# Patient Record
Sex: Female | Born: 2000 | Race: White | Hispanic: No | Marital: Single | Smoking: Former smoker
Health system: Southern US, Community
[De-identification: ages and names within clinical notes are randomized; demographics above are authoritative.]

## PROBLEM LIST (undated history)

## (undated) ENCOUNTER — Inpatient Hospital Stay (HOSPITAL_COMMUNITY): Payer: Self-pay

## (undated) DIAGNOSIS — L709 Acne, unspecified: Secondary | ICD-10-CM

## (undated) DIAGNOSIS — F419 Anxiety disorder, unspecified: Secondary | ICD-10-CM

## (undated) DIAGNOSIS — N946 Dysmenorrhea, unspecified: Secondary | ICD-10-CM

## (undated) HISTORY — DX: Acne, unspecified: L70.9

## (undated) HISTORY — DX: Anxiety disorder, unspecified: F41.9

## (undated) HISTORY — DX: Dysmenorrhea, unspecified: N94.6

---
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
## 2020-02-16 ENCOUNTER — Encounter: Payer: Self-pay | Admitting: Family

## 2020-02-16 ENCOUNTER — Telehealth (INDEPENDENT_AMBULATORY_CARE_PROVIDER_SITE_OTHER): Payer: Medicaid Other | Admitting: Family

## 2020-02-16 DIAGNOSIS — F411 Generalized anxiety disorder: Secondary | ICD-10-CM | POA: Diagnosis not present

## 2020-02-16 DIAGNOSIS — Z3041 Encounter for surveillance of contraceptive pills: Secondary | ICD-10-CM

## 2020-02-16 DIAGNOSIS — L700 Acne vulgaris: Secondary | ICD-10-CM

## 2020-02-16 DIAGNOSIS — F320 Major depressive disorder, single episode, mild: Secondary | ICD-10-CM

## 2020-02-16 MED ORDER — SERTRALINE HCL 50 MG PO TABS: 50.0000 mg | ORAL_TABLET | Freq: Every day | ORAL | 1 refills | Status: DC

## 2020-02-16 MED ORDER — NORETHIN ACE-ETH ESTRAD-FE 1-20 MG-MCG PO TABS: ORAL_TABLET | ORAL | 4 refills | Status: DC

## 2020-02-16 NOTE — Progress Notes (Signed)
Virtual Visit via telephone Note Due to COVID-19 pandemic this visit was conducted virtually. This visit type was conducted due to national recommendations for restrictions regarding the COVID-19 Pandemic (e.g. social distancing, sheltering in place) in an effort to limit this patient's exposure and mitigate transmission in our community. All issues noted in this document were discussed and addressed.  A physical exam was not performed with this format.  I connected with [name] on 02/16/20 at 4:00 pm  by telephone and verified that I am speaking with the correct person using two identifiers. Annaliza Zia is currently located at car and no one is currently with her during visit. The provider, Jannifer Rodney, FNP is located in their office at time of visit.  I discussed the limitations, risks, security and privacy concerns of performing an evaluation and management service by telephone and the availability of in person appointments. I also discussed with the patient that there may be a patient responsible charge related to this service. The patient expressed understanding and agreed to proceed.   History and Present Illness:  Pt calls the office today to restart her depression medication. She is currently taking OC. She is not currently sexually active, but has been in the past.  Anxiety Presents for follow-up visit. Symptoms include depressed mood, excessive worry, irritability, nervous/anxious behavior and restlessness. The severity of symptoms is moderate. The quality of sleep is good.    Depression        This is a chronic problem.  The current episode started more than 1 year ago.   The onset quality is gradual.   The problem occurs intermittently.  The problem has been waxing and waning since onset.  Associated symptoms include helplessness, hopelessness, irritable, restlessness and sad.  Past treatments include nothing.  Past medical history includes anxiety.       Review of Systems   Constitutional: Positive for irritability.  Psychiatric/Behavioral: Positive for depression. The patient is nervous/anxious.      Observations/Objective: No SOB or distress noted   Assessment and Plan: 1. Generalized anxiety disorder Will restart Zoloft 50 mg  Stress management  Possible adverse effects discussed RTO in 4 weeks for recheck  - sertraline (ZOLOFT) 50 MG tablet; Take 1 tablet (50 mg total) by mouth daily.  Dispense: 90 tablet; Refill: 1  2. Depression, major, single episode, mild (HCC) - sertraline (ZOLOFT) 50 MG tablet; Take 1 tablet (50 mg total) by mouth daily.  Dispense: 90 tablet; Refill: 1  3. Acne vulgaris  4. Encounter for surveillance of contraceptive pills Safe sex - norethindrone-ethinyl estradiol (LOESTRIN FE) 1-20 MG-MCG tablet; TAKE ONE (1) TABLET EACH DAY  Dispense: 3 Package; Refill: 4    I discussed the assessment and treatment plan with the patient. The patient was provided an opportunity to ask questions and all were answered. The patient agreed with the plan and demonstrated an understanding of the instructions.   The patient was advised to call back or seek an in-person evaluation if the symptoms worsen or if the condition fails to improve as anticipated.  The above assessment and management plan was discussed with the patient. The patient verbalized understanding of and has agreed to the management plan. Patient is aware to call the clinic if symptoms persist or worsen. Patient is aware when to return to the clinic for a follow-up visit. Patient educated on when it is appropriate to go to the emergency department.   Time call ended:  4:15 pm  I provided 15  minutes of non-face-to-face time during this encounter.    Evelina Dun, FNP

## 2020-03-18 DIAGNOSIS — H5213 Myopia, bilateral: Secondary | ICD-10-CM | POA: Diagnosis not present

## 2020-07-05 DIAGNOSIS — J0100 Acute maxillary sinusitis, unspecified: Secondary | ICD-10-CM | POA: Diagnosis not present

## 2020-07-05 DIAGNOSIS — R110 Nausea: Secondary | ICD-10-CM | POA: Diagnosis not present

## 2020-07-05 DIAGNOSIS — R519 Headache, unspecified: Secondary | ICD-10-CM | POA: Diagnosis not present

## 2020-07-05 DIAGNOSIS — R0981 Nasal congestion: Secondary | ICD-10-CM | POA: Diagnosis not present

## 2020-07-05 DIAGNOSIS — J029 Acute pharyngitis, unspecified: Secondary | ICD-10-CM | POA: Diagnosis not present

## 2020-07-05 DIAGNOSIS — R52 Pain, unspecified: Secondary | ICD-10-CM | POA: Diagnosis not present

## 2020-07-05 DIAGNOSIS — R509 Fever, unspecified: Secondary | ICD-10-CM | POA: Diagnosis not present

## 2020-07-23 DIAGNOSIS — K529 Noninfective gastroenteritis and colitis, unspecified: Secondary | ICD-10-CM | POA: Diagnosis not present

## 2020-07-23 DIAGNOSIS — R110 Nausea: Secondary | ICD-10-CM | POA: Diagnosis not present

## 2020-11-30 ENCOUNTER — Other Ambulatory Visit: Payer: Self-pay

## 2020-11-30 ENCOUNTER — Encounter (HOSPITAL_COMMUNITY): Payer: Self-pay

## 2020-11-30 ENCOUNTER — Ambulatory Visit (HOSPITAL_COMMUNITY)
Admission: EM | Admit: 2020-11-30 | Discharge: 2020-11-30 | Disposition: A | Discharge status: Home / Self Care | Payer: Medicaid Other | Attending: Urgent Care | Admitting: Urgent Care

## 2020-11-30 DIAGNOSIS — Z793 Long term (current) use of hormonal contraceptives: Secondary | ICD-10-CM | POA: Diagnosis not present

## 2020-11-30 DIAGNOSIS — Z79899 Other long term (current) drug therapy: Secondary | ICD-10-CM | POA: Insufficient documentation

## 2020-11-30 DIAGNOSIS — Z7722 Contact with and (suspected) exposure to environmental tobacco smoke (acute) (chronic): Secondary | ICD-10-CM | POA: Insufficient documentation

## 2020-11-30 DIAGNOSIS — Z3202 Encounter for pregnancy test, result negative: Secondary | ICD-10-CM | POA: Diagnosis not present

## 2020-11-30 DIAGNOSIS — R5381 Other malaise: Secondary | ICD-10-CM | POA: Insufficient documentation

## 2020-11-30 DIAGNOSIS — R110 Nausea: Secondary | ICD-10-CM | POA: Insufficient documentation

## 2020-11-30 DIAGNOSIS — R070 Pain in throat: Secondary | ICD-10-CM | POA: Diagnosis not present

## 2020-11-30 DIAGNOSIS — U071 COVID-19: Secondary | ICD-10-CM | POA: Insufficient documentation

## 2020-11-30 DIAGNOSIS — R6883 Chills (without fever): Secondary | ICD-10-CM | POA: Diagnosis not present

## 2020-11-30 DIAGNOSIS — B349 Viral infection, unspecified: Secondary | ICD-10-CM

## 2020-11-30 DIAGNOSIS — R5383 Other fatigue: Secondary | ICD-10-CM | POA: Insufficient documentation

## 2020-11-30 LAB — POCT URINALYSIS DIPSTICK, ED / UC
Bilirubin Urine: NEGATIVE
Glucose, UA: NEGATIVE mg/dL
Hgb urine dipstick: NEGATIVE
Ketones, ur: 40 mg/dL — AB
Leukocytes,Ua: NEGATIVE
Nitrite: NEGATIVE
Protein, ur: NEGATIVE mg/dL
Specific Gravity, Urine: >=1.03 (ref 1.005–1.030)
Urobilinogen, UA: 2 mg/dL — ABNORMAL HIGH (ref 0.0–1.0)
pH: 5.5 (ref 5.0–8.0)

## 2020-11-30 LAB — POC URINE PREG, ED: Preg Test, Ur: NEGATIVE

## 2020-11-30 MED ORDER — ONDANSETRON 4 MG PO TBDP
8.0000 mg | ORAL_TABLET | Freq: Once | ORAL | Status: AC
  Administered 2020-11-30: 8 mg via ORAL

## 2020-11-30 MED ORDER — ONDANSETRON 4 MG PO TBDP
ORAL_TABLET | ORAL | Status: AC
  Filled 2020-11-30: qty 2

## 2020-11-30 MED ORDER — ONDANSETRON 8 MG PO TBDP: 8.0000 mg | ORAL_TABLET | Freq: Three times a day (TID) | ORAL | 0 refills | Status: DC | PRN

## 2020-11-30 NOTE — ED Triage Notes (Signed)
Pt presents with nausea and chills X 3 days.

## 2020-11-30 NOTE — ED Provider Notes (Signed)
[name]   MRN: [id number] DOB: 2000-11-02  Subjective:   Cassandra Waters is a 20 y.o. female presenting for 3 day history of acute onset nausea, chills, malaise, fatigue, decreased appetite, throat pain. She is not fully COVID vaccinated. Had 1 dose over the past summer. Denies chest pain, shob. LMP was 3 weeks ago. She is on OCP, sexually active, no condom use. No concern for STI.   No current facility-administered medications for this encounter.  Current Outpatient Medications:  .  norethindrone-ethinyl estradiol (LOESTRIN FE) 1-20 MG-MCG tablet, TAKE ONE (1) TABLET EACH DAY, Disp: 3 Package, Rfl: 4 .  sertraline (ZOLOFT) 50 MG tablet, Take 1 tablet (50 mg total) by mouth daily., Disp: 90 tablet, Rfl: 1   No Known Allergies  Past Medical History:  Diagnosis Date  . Acne   . Anxiety   . Dysmenorrhea      History reviewed. No pertinent surgical history.  Family History  Problem Relation Age of Onset  . Mental illness Mother   . Mental illness Father   . Mental illness Sister   . Mental illness Maternal Grandmother     Social History   Tobacco Use  . Smoking status: Passive Smoke Exposure - Never Smoker  . Smokeless tobacco: Never Used  Vaping Use  . Vaping Use: Never used  Substance Use Topics  . Alcohol use: No  . Drug use: No    ROS   Objective:   Vitals: BP 106/67 (BP Location: Left Arm)   Pulse (!) 108   Temp 98.8 F (37.1 C) (Oral)   Resp 18   SpO2 100%   Physical Exam Constitutional:      General: She is not in acute distress.    Appearance: Normal appearance. She is well-developed. She is not ill-appearing, toxic-appearing or diaphoretic.  HENT:     Head: Normocephalic and atraumatic.     Right Ear: Tympanic membrane and ear canal normal. No drainage or tenderness. No middle ear effusion. Tympanic membrane is not erythematous.     Left Ear: Tympanic membrane and ear canal normal. No drainage or tenderness.  No middle ear  effusion. Tympanic membrane is not erythematous.     Nose: Nose normal. No congestion or rhinorrhea.     Mouth/Throat:     Mouth: Mucous membranes are moist. No oral lesions.     Pharynx: Oropharynx is clear. No pharyngeal swelling, oropharyngeal exudate, posterior oropharyngeal erythema or uvula swelling.     Tonsils: No tonsillar exudate or tonsillar abscesses.  Eyes:     Extraocular Movements: Extraocular movements intact.     Right eye: Normal extraocular motion.     Left eye: Normal extraocular motion.     Conjunctiva/sclera: Conjunctivae normal.     Pupils: Pupils are equal, round, and reactive to light.  Cardiovascular:     Rate and Rhythm: Normal rate and regular rhythm.     Pulses: Normal pulses.     Heart sounds: Normal heart sounds. No murmur heard. No friction rub. No gallop.   Pulmonary:     Effort: Pulmonary effort is normal. No respiratory distress.     Breath sounds: Normal breath sounds. No stridor. No wheezing, rhonchi or rales.  Abdominal:     General: Bowel sounds are normal. There is no distension.     Palpations: Abdomen is soft. There is no mass.     Tenderness: There is no abdominal tenderness. There is no right CVA tenderness, left CVA tenderness, guarding  or rebound.  Musculoskeletal:     Cervical back: Normal range of motion and neck supple.  Lymphadenopathy:     Cervical: No cervical adenopathy.  Skin:    General: Skin is warm and dry.     Findings: No rash.  Neurological:     General: No focal deficit present.     Mental Status: She is alert and oriented to person, place, and time.  Psychiatric:        Mood and Affect: Mood normal.        Behavior: Behavior normal.        Thought Content: Thought content normal.        Judgment: Judgment normal.     Results for orders placed or performed during the hospital encounter of 11/30/20 (from the past 24 hour(s))  POC Urinalysis dipstick     Status: Abnormal   Collection Time: 11/30/20  1:40 PM   Result Value Ref Range   Glucose, UA NEGATIVE NEGATIVE mg/dL   Bilirubin Urine NEGATIVE NEGATIVE   Ketones, ur 40 (A) NEGATIVE mg/dL   Specific Gravity, Urine >=1.030 1.005 - 1.030   Hgb urine dipstick NEGATIVE NEGATIVE   pH 5.5 5.0 - 8.0   Protein, ur NEGATIVE NEGATIVE mg/dL   Urobilinogen, UA 2.0 (H) 0.0 - 1.0 mg/dL   Nitrite NEGATIVE NEGATIVE   Leukocytes,Ua NEGATIVE NEGATIVE  POC urine pregnancy     Status: None   Collection Time: 11/30/20  1:42 PM  Result Value Ref Range   Preg Test, Ur NEGATIVE NEGATIVE    Assessment and Plan :   PDMP not reviewed this encounter.  1. Viral syndrome   2. Chills   3. Nausea   4. Malaise and fatigue   5. Throat pain     Will manage for viral illness such as viral URI, viral syndrome, viral rhinitis, COVID-19. Counseled patient on nature of COVID-19 including modes of transmission, diagnostic testing, management and supportive care.  Offered scripts for symptomatic relief. COVID 19 testing is pending. Counseled patient on potential for adverse effects with medications prescribed/recommended today, ER and return-to-clinic precautions discussed, patient verbalized understanding.     Wallis Bamberg, PA-C 11/30/20 1430

## 2020-11-30 NOTE — Discharge Instructions (Addendum)
We will notify you of your COVID-19 test results as they arrive and may take between 24 to 48 hours.  I encourage you to sign up for MyChart if you have not already done so as this can be the easiest way for [place] to communicate results to you online or through a phone app.  In the meantime, if you develop worsening symptoms including fever, chest pain, shortness of breath despite our current treatment plan then please report to the emergency room as this may be a sign of worsening status from possible COVID-19 infection.  Otherwise, we will manage this as a viral syndrome. For sore throat or cough try using a honey-based tea. Use 3 teaspoons of honey with juice squeezed from half lemon. Place shaved pieces of ginger into 1/2-1 cup of water and warm over stove top. Then mix the ingredients and repeat every 4 hours as needed. Please take Tylenol 500mg -650mg  every 6 hours for aches and pains, fevers. Hydrate very well with at least 2 liters of water. Eat light meals such as soups to replenish electrolytes and soft fruits, veggies. Start an antihistamine like Zyrtec, Allegra or Claritin for postnasal drainage, sinus congestion.  You can take this together with pseudoephedrine (Sudafed) at a dose of 60 mg 2-3 times a day as needed for the same kind of congestion.  Make sure you push fluids drinking mostly water but mix it with Gatorade.  Try to eat light meals including soups, broths and soft foods, fruits.  You may use Zofran for your nausea and vomiting once every 8 hours.

## 2020-12-01 LAB — SARS CORONAVIRUS 2 (TAT 6-24 HRS): SARS Coronavirus 2: POSITIVE — AB

## 2020-12-02 ENCOUNTER — Telehealth: Payer: Self-pay | Admitting: Unknown Physician Specialty

## 2020-12-02 NOTE — Telephone Encounter (Signed)
Called to discuss with patient about COVID-19 symptoms and the use of one of the available treatments for those with mild to moderate Covid symptoms and at a high risk of hospitalization.  Pt appears to qualify for outpatient treatment due to co-morbid conditions and/or a member of an at-risk group in accordance with the FDA Emergency Use Authorization.    Symptom onset: 2/15 Vaccinated: incomplete  Doing better with almost complete resolution of symptoms.  Out of range for oral treatments and infusions not appropriate as almost asymptomatic  Cassandra Waters

## 2021-02-14 ENCOUNTER — Other Ambulatory Visit: Payer: Self-pay

## 2021-02-14 ENCOUNTER — Encounter (HOSPITAL_COMMUNITY): Payer: Self-pay | Admitting: Emergency Medicine

## 2021-02-14 ENCOUNTER — Ambulatory Visit (HOSPITAL_COMMUNITY)
Admission: EM | Admit: 2021-02-14 | Discharge: 2021-02-14 | Disposition: A | Discharge status: Home / Self Care | Payer: Medicaid Other | Attending: Internal Medicine | Admitting: Internal Medicine

## 2021-02-14 DIAGNOSIS — H1032 Unspecified acute conjunctivitis, left eye: Secondary | ICD-10-CM | POA: Diagnosis not present

## 2021-02-14 MED ORDER — ERYTHROMYCIN 5 MG/GM OP OINT: TOPICAL_OINTMENT | OPHTHALMIC | 0 refills | Status: DC

## 2021-02-14 NOTE — ED Triage Notes (Signed)
Pt presents today with c/o of left eye redness/irritation x 2 days. +drainage.

## 2021-02-14 NOTE — Discharge Instructions (Signed)
Place the erythromycin ointment on your lower eyelid 4 times a day for the next 5-7 days.  If your symptoms have resolved after 5 days, you can stop the eye ointment.    You can apply a warm compress to eye as needed for comfort.  Make sure you wash your hands regularly.  Wash your sheets and pillow cases.  Do not use any eye make-up while your eye is infected and get rid of any eye make-up that you have recently used.    If your symptoms persist, follow up with an ophthalmologist.    Return or go to the Emergency Department if symptoms worsen or do not improve in the next few days.

## 2021-02-14 NOTE — ED Provider Notes (Signed)
[facility]    CSN: [id number] Arrival date & time: 02/14/21  0160      History   Chief Complaint Chief Complaint  Patient presents with  . Eye Drainage  . eye irritation    Left     HPI Cassandra Waters is a 20 y.o. female.   Patient here for evaluation of left eye redness, pain, and swelling that started yesterday.  Reports having a lot of drainage from eye this morning.  Denies any vision changes or photophobia.  Has not tried any OTC medications or treatments.  Denies any trauma, injury, or other precipitating event.  Denies any specific alleviating or aggravating factors.  Denies any fevers, chest pain, shortness of breath, N/V/D, numbness, tingling, weakness, abdominal pain, or headaches.     The history is provided by the patient.    Past Medical History:  Diagnosis Date  . Acne   . Anxiety   . Dysmenorrhea     Patient Active Problem List   Diagnosis Date Noted  . 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 Encounter for surveillance of contraceptive pills 02/07/2019  . 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 Eczema 04/29/2017  . Generalized anxiety disorder 08/22/2016  . Acne vulgaris 08/22/2016  . Palpitations 12/17/2015    History reviewed. No pertinent surgical history.  OB History   No obstetric history on file.      Home Medications    Prior to Admission medications   Medication Sig Start Date End Date Taking? Authorizing Provider  erythromycin ophthalmic ointment Place a 1/2 inch ribbon of ointment into the lower eyelid 4 times a day for the next 5-7 days 02/14/21  Yes Ivette Loyal, NP  norethindrone-ethinyl estradiol (LOESTRIN FE) 1-20 MG-MCG tablet TAKE ONE (1) TABLET EACH DAY 02/16/20  Yes Hawks, Christy A, FNP  ondansetron (ZOFRAN-ODT) 8 MG disintegrating tablet Take 1 tablet (8 mg total) by mouth every 8 (eight) hours as needed for nausea or vomiting. 11/30/20   Wallis Bamberg, PA-C  sertraline (ZOLOFT) 50 MG tablet Take 1 tablet (50 mg  total) by mouth daily. 02/16/20   Junie Spencer, FNP    Family History Family History  Problem Relation Age of Onset  . Mental illness Mother   . Mental illness Father   . Mental illness Sister   . Mental illness Maternal Grandmother     Social History Social History   Tobacco Use  . Smoking status: Passive Smoke Exposure - Never Smoker  . Smokeless tobacco: Never Used  Vaping Use  . Vaping Use: Never used  Substance Use Topics  . Alcohol use: No  . Drug use: No     Allergies   Patient has no known allergies.   Review of Systems Review of Systems  Eyes: Positive for pain, discharge and redness. Negative for photophobia and visual disturbance.  All other systems reviewed and are negative.    Physical Exam Triage Vital Signs ED Triage Vitals  Enc Vitals Group     BP 02/14/21 0847 109/73     Pulse Rate 02/14/21 0847 80     Resp 02/14/21 0847 16     Temp 02/14/21 0847 98 F (36.7 C)     Temp Source 02/14/21 0847 Oral     SpO2 02/14/21 0847 97 %     Weight --      Height --      Head Circumference --      Peak Flow --  Pain Score 02/14/21 0844 2     Pain Loc --      Pain Edu? --      Excl. in GC? --    No data found.  Updated Vital Signs BP 109/73 (BP Location: Left Arm)   Pulse 80   Temp 98 F (36.7 C) (Oral)   Resp 16   LMP 02/07/2021 (Approximate)   SpO2 97%   Visual Acuity Right Eye Distance: 20/30 Left Eye Distance: 20/70 Bilateral Distance: 20/15  Right Eye Near:   Left Eye Near:    Bilateral Near:     Physical Exam Vitals and nursing note reviewed.  Constitutional:      General: She is not in acute distress.    Appearance: Normal appearance. She is not ill-appearing, toxic-appearing or diaphoretic.  HENT:     Head: Normocephalic and atraumatic.  Eyes:     General: Lids are normal.        Left eye: Discharge present.    Conjunctiva/sclera:     Left eye: Left conjunctiva is injected. Exudate present. No chemosis or  hemorrhage. Cardiovascular:     Rate and Rhythm: Normal rate.     Pulses: Normal pulses.  Pulmonary:     Effort: Pulmonary effort is normal.  Abdominal:     General: Abdomen is flat.  Musculoskeletal:        General: Normal range of motion.     Cervical back: Normal range of motion.  Skin:    General: Skin is warm and dry.  Neurological:     General: No focal deficit present.     Mental Status: She is alert and oriented to person, place, and time.  Psychiatric:        Mood and Affect: Mood normal.      UC Treatments / Results  Labs (all labs ordered are listed, but only abnormal results are displayed) Labs Reviewed - No data to display  EKG   Radiology No results found.  Procedures Procedures (including critical care time)  Medications Ordered in UC Medications - No data to display  Initial Impression / Assessment and Plan / UC Course  I have reviewed the triage vital signs and the nursing notes.  Pertinent labs & imaging results that were available during my care of the patient were reviewed by me and considered in my medical decision making (see chart for details).     Assessments negative for red flags or concerns.  This is likely conjunctivitis.  Treat with erythromycin 4 times daily for the next 5 to 7 days.  May stop ointment after 5 days if symptoms have resolved.  May apply warm compress as needed for comfort.  Encourage patient to wash sheets and pillowcases.  Patient instructed to discard any recently used eye make-up and to not wear any eye make-up while undergoing treatment.  If symptoms do not resolve follow-up with ophthalmology.  Final Clinical Impressions(s) / UC Diagnoses   Final diagnoses:  Acute bacterial conjunctivitis of left eye     Discharge Instructions     Place the erythromycin ointment on your lower eyelid 4 times a day for the next 5-7 days.  If your symptoms have resolved after 5 days, you can stop the eye ointment.    You can  apply a warm compress to eye as needed for comfort.  Make sure you wash your hands regularly.  Wash your sheets and pillow cases.  Do not use any eye make-up while your eye is infected  and get rid of any eye make-up that you have recently used.    If your symptoms persist, follow up with an ophthalmologist.    Return or go to the Emergency Department if symptoms worsen or do not improve in the next few days.      ED Prescriptions    Medication Sig Dispense Auth. Provider   erythromycin ophthalmic ointment Place a 1/2 inch ribbon of ointment into the lower eyelid 4 times a day for the next 5-7 days 3.5 g Ivette Loyal, NP     PDMP not reviewed this encounter.   Ivette Loyal, NP 02/14/21 [id number]

## 2021-04-23 ENCOUNTER — Telehealth: Payer: Self-pay

## 2021-04-23 NOTE — Telephone Encounter (Signed)
Pt needs a refill on birth control and is wanting to have an over the phone appt. She is aware that she may need to come in since the last time she had her birth control refilled it was done over the phone. Pt also aware that she needs a new PCP because she was an [employer] pt. Please call back and advise.

## 2021-04-23 NOTE — Telephone Encounter (Signed)
Please let her know she needs to be seen in the office.

## 2021-04-23 NOTE — Telephone Encounter (Signed)
Patient aware she states she will call back to make an appointment

## 2021-04-23 NOTE — Telephone Encounter (Signed)
Last visit was with you video over a year please advise

## 2021-05-02 DIAGNOSIS — N914 Secondary oligomenorrhea: Secondary | ICD-10-CM | POA: Diagnosis not present

## 2021-05-02 DIAGNOSIS — Z0000 Encounter for general adult medical examination without abnormal findings: Secondary | ICD-10-CM | POA: Diagnosis not present

## 2021-05-02 DIAGNOSIS — Z6838 Body mass index (BMI) 38.0-38.9, adult: Secondary | ICD-10-CM | POA: Diagnosis not present

## 2021-05-15 ENCOUNTER — Ambulatory Visit: Payer: Medicaid Other | Admitting: Family Medicine

## 2022-10-24 ENCOUNTER — Encounter (HOSPITAL_COMMUNITY): Payer: Self-pay

## 2022-10-24 ENCOUNTER — Emergency Department (HOSPITAL_COMMUNITY)
Admission: EM | Admit: 2022-10-24 | Discharge: 2022-10-24 | Disposition: A | Discharge status: Home / Self Care | Payer: BC Managed Care – PPO | Attending: Student | Admitting: Student

## 2022-10-24 DIAGNOSIS — J020 Streptococcal pharyngitis: Secondary | ICD-10-CM | POA: Diagnosis not present

## 2022-10-24 DIAGNOSIS — Z20822 Contact with and (suspected) exposure to covid-19: Secondary | ICD-10-CM | POA: Diagnosis not present

## 2022-10-24 DIAGNOSIS — J029 Acute pharyngitis, unspecified: Secondary | ICD-10-CM | POA: Diagnosis present

## 2022-10-24 LAB — RESP PANEL BY RT-PCR (RSV, FLU A&B, COVID)  RVPGX2
Influenza A by PCR: NEGATIVE
Influenza B by PCR: NEGATIVE
Resp Syncytial Virus by PCR: NEGATIVE
SARS Coronavirus 2 by RT PCR: NEGATIVE

## 2022-10-24 LAB — GROUP A STREP BY PCR: Group A Strep by PCR: DETECTED — AB

## 2022-10-24 MED ORDER — PENICILLIN G BENZATHINE 1200000 UNIT/2ML IM SUSY
1.2000 10*6.[IU] | PREFILLED_SYRINGE | Freq: Once | INTRAMUSCULAR | Status: AC
Start: 2022-10-24 — End: 2022-10-24
  Administered 2022-10-24: 1.2 10*6.[IU] via INTRAMUSCULAR
  Filled 2022-10-24: qty 2

## 2022-10-24 NOTE — ED Provider Notes (Signed)
[place] DEPT Provider Note   CSN: [id number] Arrival date & time: 10/24/22  1017     History  Chief Complaint  Patient presents with   Sore Throat    Cassandra Waters is a 22 y.o. female with medical history of anxiety, dysmenorrhea.  Patient presents to ED for evaluation of sore throat.  Patient reports waking up this morning with sore throat.  Patient denies sick contacts.  Patient also endorsing body aches and chills at home.  Patient denies fevers, nausea, vomiting or diarrhea.  Patient reports the symptoms been on and off ever since Christmas time, she was seen then and had a negative COVID and flu swab.   Sore Throat       Home Medications Prior to Admission medications   Medication Sig Start Date End Date Taking? Authorizing Provider  erythromycin ophthalmic ointment Place a 1/2 inch ribbon of ointment into the lower eyelid 4 times a day for the next 5-7 days 02/14/21   Pearson Forster, NP  norethindrone-ethinyl estradiol (LOESTRIN FE) 1-20 MG-MCG tablet TAKE ONE (1) TABLET EACH DAY 02/16/20   Evelina Dun A, FNP  ondansetron (ZOFRAN-ODT) 8 MG disintegrating tablet Take 1 tablet (8 mg total) by mouth every 8 (eight) hours as needed for nausea or vomiting. 11/30/20   Jaynee Eagles, PA-C  sertraline (ZOLOFT) 50 MG tablet Take 1 tablet (50 mg total) by mouth daily. 02/16/20   Sharion Balloon, FNP      Allergies    Patient has no known allergies.    Review of Systems   Review of Systems  Constitutional:  Negative for fever.  HENT:  Positive for sore throat. Negative for trouble swallowing.   Gastrointestinal:  Negative for diarrhea, nausea and vomiting.  All other systems reviewed and are negative.   Physical Exam Updated Vital Signs BP 116/80   Pulse 80   Temp 98.4 F (36.9 C) (Oral)   Resp 18   SpO2 100%  Physical Exam Vitals and nursing note reviewed.  Constitutional:      Appearance: Normal appearance.  HENT:     Head:  Normocephalic and atraumatic.     Mouth/Throat:     Mouth: Mucous membranes are moist.     Pharynx: Oropharynx is clear. Posterior oropharyngeal erythema present. No oropharyngeal exudate.  Eyes:     Extraocular Movements: Extraocular movements intact.     Conjunctiva/sclera: Conjunctivae normal.     Pupils: Pupils are equal, round, and reactive to light.  Cardiovascular:     Rate and Rhythm: Normal rate and regular rhythm.  Pulmonary:     Effort: Pulmonary effort is normal.     Breath sounds: Normal breath sounds. No wheezing.  Abdominal:     General: Abdomen is flat. Bowel sounds are normal.     Palpations: Abdomen is soft.     Tenderness: There is no abdominal tenderness.  Musculoskeletal:     Cervical back: Normal range of motion and neck supple. No tenderness.  Skin:    Capillary Refill: Capillary refill takes less than 2 seconds.  Neurological:     General: No focal deficit present.     Mental Status: She is alert.     ED Results / Procedures / Treatments   Labs (all labs ordered are listed, but only abnormal results are displayed) Labs Reviewed  GROUP A STREP BY PCR - Abnormal; Notable for the following components:      Result Value   Group A Strep by PCR  DETECTED (*)    All other components within normal limits  RESP PANEL BY RT-PCR (RSV, FLU A&B, COVID)  [id number]    EKG None  Radiology No results found.  Procedures Procedures   Medications Ordered in ED Medications  penicillin g benzathine (BICILLIN LA) 1200000 UNIT/2ML injection 1.2 Million Units (has no administration in time range)    ED Course/ Medical Decision Making/ A&P                           Medical Decision Making Risk Prescription drug management.   22 year old female presents to ED for evaluation of sore throat.  Please see HPI for further details.  On examination the patient is afebrile and nontachycardic.  Patient sounds are clear bilaterally, she is not hypoxic.  Patient abdomen  soft and compressible throughout.  Patient posterior oropharynx is erythematous however no exudate.  Patient not drooling, no change in phonation.  Patient nontoxic in appearance.  Patient group A strep testing is positive.  Patient viral testing is negative for all.  Patient treated with 1,200,000 units benzathine.  Patient will be advised to treat symptoms at home conservatively.  Patient given return precautions and she voiced understanding.  The patient is stable for discharge.  Final Clinical Impression(s) / ED Diagnoses Final diagnoses:  Pharyngitis due to Streptococcus species    Rx / DC Orders ED Discharge Orders     None         Lawana Chambers 10/24/22 [place]    Kommor, [place], MD 10/25/22 1911

## 2022-10-24 NOTE — Discharge Instructions (Addendum)
Return to ED with any new or worsening signs or symptoms Please read attached guide concerning strep throat Please treat symptoms at home conservatively utilizing ibuprofen and Tylenol, warm salt water rinses Please see attached work note.  It will be the last page of this packet.

## 2022-10-24 NOTE — ED Triage Notes (Signed)
Pt arrived via POV, c/o sore throat, generalized bodyaches and headache. States sx have been going on since around [place]. Was seen by PCP around that time and negative for COVID and flu.

## 2022-11-10 ENCOUNTER — Encounter (HOSPITAL_COMMUNITY): Payer: Self-pay

## 2022-11-10 ENCOUNTER — Emergency Department (HOSPITAL_COMMUNITY)
Admission: EM | Admit: 2022-11-10 | Discharge: 2022-11-10 | Disposition: A | Discharge status: Home / Self Care | Payer: BC Managed Care – PPO | Attending: Emergency Medicine | Admitting: Emergency Medicine

## 2022-11-10 DIAGNOSIS — M79644 Pain in right finger(s): Secondary | ICD-10-CM | POA: Diagnosis present

## 2022-11-10 DIAGNOSIS — L03011 Cellulitis of right finger: Secondary | ICD-10-CM | POA: Insufficient documentation

## 2022-11-10 MED ORDER — LIDOCAINE HCL 2 % IJ SOLN
10.0000 mL | Freq: Once | INTRAMUSCULAR | Status: AC
  Administered 2022-11-10: 200 mg
  Filled 2022-11-10: qty 20

## 2022-11-10 NOTE — Discharge Instructions (Signed)
Return to ED with any new symptoms Please follow-up with PCP for further management Please soak finger in Epsom salt baths 3 times daily.  Please keep finger covered with gauze, triple antibiotic ointment for the next 3 to 5 days. Please read attached guide concerning paronychia

## 2022-11-10 NOTE — ED Triage Notes (Signed)
Pt presents with c/o right middle finger pain, redness, and swelling for 8 days. The pain, swelling, and redness is all on the tip of the finger.

## 2022-11-10 NOTE — ED Provider Notes (Signed)
[place] EMERGENCY DEPARTMENT AT [facility] Provider Note   CSN: [id number] Arrival date & time: 11/10/22  1346     History  Chief Complaint  Patient presents with   Hand Pain    Cassandra Waters is a 22 y.o. female with medical history of anxiety.  Patient presents to the ED for evaluation of swelling to the cuticle area of her right middle finger.  Patient reports this is been present for the last 8 days.  Patient reports slight drainage from this area.  Patient denies history of the same.  Patient denies fevers, nausea or vomiting.   Hand Pain       Home Medications Prior to Admission medications   Medication Sig Start Date End Date Taking? Authorizing Provider  erythromycin ophthalmic ointment Place a 1/2 inch ribbon of ointment into the lower eyelid 4 times a day for the next 5-7 days 02/14/21   Pearson Forster, NP  norethindrone-ethinyl estradiol (LOESTRIN FE) 1-20 MG-MCG tablet TAKE ONE (1) TABLET EACH DAY 02/16/20   Evelina Dun A, FNP  ondansetron (ZOFRAN-ODT) 8 MG disintegrating tablet Take 1 tablet (8 mg total) by mouth every 8 (eight) hours as needed for nausea or vomiting. 11/30/20   Jaynee Eagles, PA-C  sertraline (ZOLOFT) 50 MG tablet Take 1 tablet (50 mg total) by mouth daily. 02/16/20   Sharion Balloon, FNP      Allergies    Patient has no known allergies.    Review of Systems   Review of Systems  Constitutional:  Negative for fever.  Gastrointestinal:  Negative for nausea and vomiting.  Skin:        Paronychia  All other systems reviewed and are negative.   Physical Exam Updated Vital Signs BP 103/68 (BP Location: Right Arm)   Pulse 82   Temp 98.1 F (36.7 C) (Oral)   Resp 18   LMP 10/11/2022 (Approximate)   SpO2 98%  Physical Exam Vitals and nursing note reviewed.  Constitutional:      General: She is not in acute distress.    Appearance: She is well-developed.  HENT:     Head: Normocephalic and atraumatic.  Eyes:      Conjunctiva/sclera: Conjunctivae normal.  Cardiovascular:     Rate and Rhythm: Normal rate and regular rhythm.     Heart sounds: No murmur heard. Pulmonary:     Effort: Pulmonary effort is normal. No respiratory distress.     Breath sounds: Normal breath sounds.  Abdominal:     Palpations: Abdomen is soft.     Tenderness: There is no abdominal tenderness.  Musculoskeletal:        General: No swelling.     Cervical back: Neck supple.     Comments: Erythema, fluctuance to proximal finger nail fold about cuticle consistent with paronychia, no drainage  Skin:    General: Skin is warm and dry.     Capillary Refill: Capillary refill takes less than 2 seconds.  Neurological:     Mental Status: She is alert.  Psychiatric:        Mood and Affect: Mood normal.     ED Results / Procedures / Treatments   Labs (all labs ordered are listed, but only abnormal results are displayed) Labs Reviewed - No data to display  EKG None  Radiology No results found.  Procedures .Marland KitchenIncision and Drainage  Date/Time: 11/10/2022 3:39 PM  Performed by: Azucena Cecil, PA-C Authorized by: Azucena Cecil, PA-C   Consent:  Consent obtained:  Verbal   Consent given by:  Patient   Risks, benefits, and alternatives were discussed: yes     Risks discussed:  Bleeding, damage to other organs, infection, incomplete drainage and pain   Alternatives discussed:  No treatment Universal protocol:    Patient identity confirmed:  Verbally with patient Location:    Indications for incision and drainage: Paronychia.   Location:  Upper extremity   Upper extremity location:  Finger   Finger location:  R long finger Pre-procedure details:    Skin preparation:  Povidone-iodine Anesthesia:    Anesthesia method:  Local infiltration   Local anesthetic:  Lidocaine 2% w/o epi Procedure type:    Complexity:  Simple Procedure details:    Incision types:  Stab incision   Wound management:  Probed and  deloculated   Drainage:  Bloody and purulent   Drainage amount:  Scant   Packing materials:  None Post-procedure details:    Procedure completion:  Tolerated well, no immediate complications     Medications Ordered in ED Medications  lidocaine (XYLOCAINE) 2 % (with pres) injection 200 mg (200 mg Other Given by Other 11/10/22 1456)    ED Course/ Medical Decision Making/ A&P                          Medical Decision Making Risk Prescription drug management.   22 year old female presents to the ED for evaluation.  Please see HPI for further details.  On examination patient afebrile and nontachycardic.  Patient sounds good bilaterally, not hypoxic room air.  Patient abdomen soft compressible throughout.  Patient does have fluctuance, erythema to proximal nail fold about right middle finger consistent with paronychia.  There is no drainage.  Patient is neurovascularly intact to her finger, hand.  No sign of felon.  Patient paronychia drained per procedure note.  Small amount of pus was returned.  Patient be discharged and advised to soak right middle finger in Epsom salt baths 3 times daily and keep right middle finger covered with triple antibiotic ointment for the next 3 to 5 days.  Patient advised to follow-up with PCP.  Patient given return precautions and she voiced understanding.  Patient had all of her questions answered her satisfaction.  The patient is stable for discharge.   Final Clinical Impression(s) / ED Diagnoses Final diagnoses:  Paronychia of finger, right    Rx / DC Orders ED Discharge Orders     None         Lawana Chambers 11/10/22 1541    Carmin Muskrat, MD 11/10/22 1645

## 2022-11-16 ENCOUNTER — Emergency Department (HOSPITAL_COMMUNITY)
Admission: EM | Admit: 2022-11-16 | Discharge: 2022-11-16 | Disposition: A | Discharge status: Home / Self Care | Payer: BC Managed Care – PPO | Attending: Emergency Medicine | Admitting: Emergency Medicine

## 2022-11-16 ENCOUNTER — Other Ambulatory Visit: Payer: Self-pay

## 2022-11-16 ENCOUNTER — Emergency Department (HOSPITAL_COMMUNITY): Payer: BC Managed Care – PPO

## 2022-11-16 DIAGNOSIS — L03011 Cellulitis of right finger: Secondary | ICD-10-CM | POA: Diagnosis not present

## 2022-11-16 DIAGNOSIS — M79641 Pain in right hand: Secondary | ICD-10-CM | POA: Diagnosis present

## 2022-11-16 MED ORDER — DOXYCYCLINE HYCLATE 100 MG PO CAPS: 100.0000 mg | ORAL_CAPSULE | Freq: Two times a day (BID) | ORAL | 0 refills | Status: AC

## 2022-11-16 MED ORDER — LIDOCAINE HCL (PF) 1 % IJ SOLN
10.0000 mL | Freq: Once | INTRAMUSCULAR | Status: AC
  Administered 2022-11-16: 10 mL
  Filled 2022-11-16: qty 30

## 2022-11-16 MED ORDER — DOXYCYCLINE HYCLATE 100 MG PO TABS
100.0000 mg | ORAL_TABLET | Freq: Once | ORAL | Status: AC
  Administered 2022-11-16: 100 mg via ORAL
  Filled 2022-11-16: qty 1

## 2022-11-16 MED ORDER — DOXYCYCLINE HYCLATE 100 MG PO CAPS: 100.0000 mg | ORAL_CAPSULE | Freq: Two times a day (BID) | ORAL | 0 refills | Status: DC

## 2022-11-16 NOTE — ED Triage Notes (Signed)
C/o right middle finger pain with redness and swelling x2 weeks.  Pt reports last night started having pus drainage.  Per patient I&D on 1/29.  Denies fever, bodyaches, chills.

## 2022-11-16 NOTE — Discharge Instructions (Addendum)
You are seen today for infection of your finger.  You are given antibiotics after we drained the small abscess.  Keep the area clean and dry keep it covered especially when at work and he can continue using the soaks a 1-2 times a day.  Follow-up for a wound check, 2 days and come back to the ER for new or worsening symptoms.

## 2022-11-16 NOTE — ED Provider Notes (Signed)
[place] EMERGENCY DEPARTMENT AT [facility] Provider Note   CSN: [id number] Arrival date & time: 11/16/22  1409     History  Chief Complaint  Patient presents with   Hand Pain    Cassandra Waters is a 22 y.o. female.  Denies any past medical history.  She had a paronychia drained 5 days ago and has been using antibiotic cream and Epsom salt soaks as directed but has noted that the redness has increased around the nail fold, and she is having a lot of tenderness where feels like the skin is growing up arrival nail laterally.   Hand Pain       Home Medications Prior to Admission medications   Medication Sig Start Date End Date Taking? Authorizing Provider  erythromycin ophthalmic ointment Place a 1/2 inch ribbon of ointment into the lower eyelid 4 times a day for the next 5-7 days 02/14/21   Pearson Forster, NP  norethindrone-ethinyl estradiol (LOESTRIN FE) 1-20 MG-MCG tablet TAKE ONE (1) TABLET EACH DAY 02/16/20   Evelina Dun A, FNP  ondansetron (ZOFRAN-ODT) 8 MG disintegrating tablet Take 1 tablet (8 mg total) by mouth every 8 (eight) hours as needed for nausea or vomiting. 11/30/20   Jaynee Eagles, PA-C  sertraline (ZOLOFT) 50 MG tablet Take 1 tablet (50 mg total) by mouth daily. 02/16/20   Sharion Balloon, FNP      Allergies    Patient has no known allergies.    Review of Systems   Review of Systems  Physical Exam Updated Vital Signs BP 107/79   Pulse 83   Temp 98.5 F (36.9 C)   Resp 20   Wt 59 kg   LMP 10/20/2022   SpO2 99%   BMI 25.40 kg/m  Physical Exam Vitals and nursing note reviewed.  Constitutional:      General: She is not in acute distress.    Appearance: She is well-developed.  HENT:     Head: Normocephalic and atraumatic.  Eyes:     Conjunctiva/sclera: Conjunctivae normal.  Cardiovascular:     Rate and Rhythm: Normal rate and regular rhythm.     Heart sounds: No murmur heard. Pulmonary:     Effort: Pulmonary effort is normal. No  respiratory distress.     Breath sounds: Normal breath sounds.  Abdominal:     Palpations: Abdomen is soft.     Tenderness: There is no abdominal tenderness.  Musculoskeletal:        General: No swelling.     Cervical back: Neck supple.  Skin:    General: Skin is warm and dry.     Capillary Refill: Capillary refill takes less than 2 seconds.     Comments: Cellulitis around nail fold medial lateral and proximal middle finger of right hand with fluctuance, no active drainage.  Neurological:     Mental Status: She is alert and oriented to person, place, and time.  Psychiatric:        Mood and Affect: Mood normal.     ED Results / Procedures / Treatments   Labs (all labs ordered are listed, but only abnormal results are displayed) Labs Reviewed - No data to display  EKG None  Radiology DG Finger Middle Right  Result Date: 11/16/2022 CLINICAL DATA:  Pain, redness and swelling to distal phalanx. EXAM: RIGHT MIDDLE FINGER 2+V COMPARISON:  None Available. FINDINGS: Mild diffuse edema about the distal phalanx. No underlying fracture or dislocation identified. No significant arthropathy. IMPRESSION: Soft tissue edema.  No acute bone abnormality. Electronically Signed   By: Kerby Moors M.D.   On: 11/16/2022 15:13    Procedures .Marland KitchenIncision and Drainage  Date/Time: 11/16/2022 4:37 PM  Performed by: Gwenevere Abbot, PA-C Authorized by: Gwenevere Abbot, PA-C   Consent:    Consent obtained:  Verbal   Consent given by:  Patient   Risks discussed:  Bleeding, incomplete drainage and pain   Alternatives discussed:  No treatment Universal protocol:    Patient identity confirmed:  Verbally with patient Location:    Size:  Paronychia   Location: right middle finger. Pre-procedure details:    Skin preparation:  Povidone-iodine Sedation:    Sedation type:  None Anesthesia:    Anesthesia method:  Nerve block   Block needle gauge:  25 G   Block anesthetic:  Lidocaine 1% w/o epi    Block technique:  Digital block   Block outcome:  Anesthesia achieved Procedure type:    Complexity:  Simple Procedure details:    Incision types:  Stab incision   Wound management:  Probed and deloculated   Drainage:  Purulent and bloody   Drainage amount:  Moderate   Packing materials:  None Post-procedure details:    Procedure completion:  Tolerated well, no immediate complications     Medications Ordered in ED Medications  lidocaine (PF) (XYLOCAINE) 1 % injection 10 mL (10 mLs Infiltration Given by Other 11/16/22 1454)    ED Course/ Medical Decision Making/ A&P                             Medical Decision Making This patient presents to the ED for concern of left finger infection, this involves an extensive number of treatment options, and is a complaint that carries with it a high risk of complications and morbidity.  The differential diagnosis include paronychia, felon,, cellulitis, osteomyelitis, other   Additional history obtained:  Additional history obtained from EMR External records from outside source obtained and reviewed including prior ED visit    Imaging Studies ordered:  I ordered imaging studies including left middle finger x-ray I independently visualized and interpreted imaging which showed no fracture or dislocation, no bony changes I agree with the radiologist interpretation     Problem List / ED Course / Critical interventions / Medication management  Paronychia-patient had this drained about 5 days ago has been keeping it covered and doing soaks but the swelling is getting worse and there is more redness.  Small accumulation of pus was drained and patient now has obvious cellulitis so was put on doxycycline at this time.  She is advised on close follow-up for wound check and strict return precautions. I ordered medication including lidocaine for local anesthesia Reevaluation of the patient after these medicines showed that the patient improved I  have reviewed the patients home medicines and have made adjustments as needed        Amount and/or Complexity of Data Reviewed Radiology: ordered.  Risk Prescription drug management.           Final Clinical Impression(s) / ED Diagnoses Final diagnoses:  None    Rx / DC Orders ED Discharge Orders     None         Darci Current 11/16/22 1655    Isla Pence, MD 11/16/22 1744

## 2023-02-21 ENCOUNTER — Ambulatory Visit (HOSPITAL_COMMUNITY)
Admission: EM | Admit: 2023-02-21 | Discharge: 2023-02-21 | Disposition: A | Discharge status: Home / Self Care | Payer: BC Managed Care – PPO

## 2023-02-21 ENCOUNTER — Encounter (HOSPITAL_COMMUNITY): Payer: Self-pay

## 2023-02-21 DIAGNOSIS — H1013 Acute atopic conjunctivitis, bilateral: Secondary | ICD-10-CM | POA: Diagnosis not present

## 2023-02-21 DIAGNOSIS — H0289 Other specified disorders of eyelid: Secondary | ICD-10-CM | POA: Diagnosis not present

## 2023-02-21 NOTE — ED Triage Notes (Signed)
Patient here today with c/o some irritation to the corner of both her eyes for the past couple days. Patient states that it started in the right eye and has moved. It has not affected her vision but if the light is really bright it does bother her eyes. Swelling in the right eye started today. She has a h/o eye infection in the past and had some left over eye ointment for that which she did use last night which helped the itching stop.

## 2023-02-21 NOTE — ED Provider Notes (Signed)
[facility]    CSN: [id number] Arrival date & time: 02/21/23  1005      History   Chief Complaint Chief Complaint  Patient presents with   Eye Pain    HPI Cassandra Waters is a 22 y.o. female.   Patient presents to urgent care for evaluation of right eye pain and swelling that started 2 days ago to the right eye and then spread to the left. Reports watery/itchy and dry eyes. Swelling present to the upper eyelids suddenly. She questions if some debris got into her eye as she works for [employer] She also states she sometimes gets eyelashes in her eyes. Eyes have had minimal drainage and are mostly dry. If the eyes do drain, the drainage is watery. No changes in vision, headaches, or fever/chills. No history of seasonal allergies. Does not wear glasses or contacts for vision correction. No known recent trauma or injuries to the eyes.  She denies crusty drainage on waking from the eyes and states that she does not feel this is pinkeye.  She has had pink eye before and had leftover eye ointment that she used to the right eye last night and states this helped.    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 No pertinent surgical history.  OB History   No obstetric history on file.      Home Medications    Prior to Admission medications   Medication Sig Start Date End Date Taking? Authorizing Provider  erythromycin ophthalmic ointment Place a 1/2 inch ribbon of ointment into the lower eyelid 4 times a day for the next 5-7 days 02/14/21  Yes Ivette Loyal, NP  norethindrone-ethinyl estradiol  (LOESTRIN FE) 1-20 MG-MCG tablet TAKE ONE (1) TABLET EACH DAY 02/16/20  Yes Hawks, Christy A, FNP  ondansetron (ZOFRAN-ODT) 8 MG disintegrating tablet Take 1 tablet (8 mg total) by mouth every 8 (eight) hours as needed for nausea or vomiting. 11/30/20   Wallis Bamberg, PA-C  sertraline (ZOLOFT) 50 MG tablet Take 1 tablet (50 mg total) by mouth daily. 02/16/20   Junie Spencer, FNP    Family History Family History  Problem Relation Age of Onset   Mental illness Mother    Mental illness Father    Mental illness Sister    Mental illness Maternal Grandmother     Social History Social History   Tobacco Use   Smoking status: Passive Smoke Exposure - Never Smoker   Smokeless tobacco: Never  Vaping Use   Vaping Use: Never used  Substance Use Topics   Alcohol use: No   Drug use: No     Allergies   Patient has no known allergies.   Review of Systems Review of Systems  Eyes:  Positive for pain.  Per HPI   Physical Exam Triage Vital Signs ED Triage Vitals  Enc Vitals Group     BP 02/21/23 1029 117/78     Pulse Rate 02/21/23 1029 70     Resp 02/21/23 1029 16     Temp 02/21/23 1029 98.2 F (36.8 C)  Temp Source 02/21/23 1029 Oral     SpO2 02/21/23 1029 98 %     Weight 02/21/23 1028 150 lb (68 kg)     Height 02/21/23 1028 5\' 1"  (1.549 m)     Head Circumference --      Peak Flow --      Pain Score 02/21/23 1027 9     Pain Loc --      Pain Edu? --      Excl. in GC? --    No data found.  Updated Vital Signs BP 117/78 (BP Location: Left Arm)   Pulse 70   Temp 98.2 F (36.8 C) (Oral)   Resp 16   Ht 5\' 1"  (1.549 m)   Wt 150 lb (68 kg)   SpO2 98%   BMI 28.34 kg/m   Visual Acuity Right Eye Distance:   Left Eye Distance:   Bilateral Distance:    Right Eye Near:   Left Eye Near:    Bilateral Near:     Physical Exam Vitals and nursing note reviewed.  Constitutional:      Appearance: She is not ill-appearing or toxic-appearing.  HENT:     Head: Normocephalic  and atraumatic.     Right Ear: Hearing, tympanic membrane, ear canal and external ear normal.     Left Ear: Hearing, tympanic membrane, ear canal and external ear normal.     Nose: Nose normal.     Mouth/Throat:     Lips: Pink.     Mouth: Mucous membranes are moist. No injury.     Tongue: No lesions. Tongue does not deviate from midline.     Palate: No mass and lesions.     Pharynx: Oropharynx is clear. Uvula midline. No pharyngeal swelling, oropharyngeal exudate, posterior oropharyngeal erythema or uvula swelling.     Tonsils: No tonsillar exudate or tonsillar abscesses.  Eyes:     General: Lids are normal. Vision grossly intact. Gaze aligned appropriately.        Right eye: No foreign body or discharge.        Left eye: No foreign body or discharge.     Extraocular Movements: Extraocular movements intact.     Conjunctiva/sclera: Conjunctivae normal.     Right eye: Right conjunctiva is not injected. No chemosis, exudate or hemorrhage.    Left eye: Left conjunctiva is not injected. No chemosis, exudate or hemorrhage.    Pupils: Pupils are equal, round, and reactive to light.     Comments: EOMs intact bilaterally without pain or dizziness elicited.  Small area of irritation and soft tissue swelling present to the outer aspect of the right upper eyelid not consistent with hordeolum, however likely due to irritation from rubbing eyes.  No evidence of chalazion.  Cardiovascular:     Rate and Rhythm: Normal rate and regular rhythm.     Heart sounds: Normal heart sounds, S1 normal and S2 normal.  Pulmonary:     Effort: Pulmonary effort is normal. No respiratory distress.     Breath sounds: Normal breath sounds and air entry.  Musculoskeletal:     Cervical back: Neck supple.  Skin:    General: Skin is warm and dry.     Capillary Refill: Capillary refill takes less than 2 seconds.     Findings: No rash.  Neurological:     General: No focal deficit present.     Mental Status: She is alert  and oriented to person, place, and time. Mental status is at  baseline.     Cranial Nerves: No dysarthria or facial asymmetry.  Psychiatric:        Mood and Affect: Mood normal.        Speech: Speech normal.        Behavior: Behavior normal.        Thought Content: Thought content normal.        Judgment: Judgment normal.      UC Treatments / Results  Labs (all labs ordered are listed, but only abnormal results are displayed) Labs Reviewed - No data to display  EKG   Radiology No results found.  Procedures Procedures (including critical care time)  Medications Ordered in UC Medications - No data to display  Initial Impression / Assessment and Plan / UC Course  I have reviewed the triage vital signs and the nursing notes.  Pertinent labs & imaging results that were available during my care of the patient were reviewed by me and considered in my medical decision making (see chart for details).   1.  Allergic conjunctivitis of both eyes, irritation of eyelid Presentation is consistent with acute allergic conjunctivitis that will likely respond well to Pataday eyedrops as well as as needed use of lubricating eyedrops over-the-counter.  Both of these medications may be purchased over-the-counter and used as needed.  Warm compresses prior to applying eyedrops recommended.  Low suspicion for bacterial etiology of symptoms.  HEENT exam is stable.  May follow-up with urgent care or primary care as needed should symptoms fail to improve or worsen over the next few days.  Work excuse note given.  Discussed physical exam and available lab work findings in clinic with patient.  Counseled patient regarding appropriate use of medications and potential side effects for all medications recommended or prescribed today. Discussed red flag signs and symptoms of worsening condition,when to call the PCP office, return to urgent care, and when to seek higher level of care in the emergency department.  Patient verbalizes understanding and agreement with plan. All questions answered. Patient discharged in stable condition.    Final Clinical Impressions(s) / UC Diagnoses   Final diagnoses:  Allergic conjunctivitis of both eyes  Irritation of eyelid     Discharge Instructions      Your symptoms are like due to allergic conjunctivitis.  Please use olopatadine (Pataday) eyedrops twice daily for the next several days to help with symptoms.  You may also purchase lubricating eyedrops over-the-counter to help with dry eyes.  Perform warm compresses to your eyes before applying drops to reduce swelling and inflammation/irritation.  If you notice that the swelling is getting worse or if you notice any crusting/white/yellow drainage to your eyes or if your eyes become very red and pain worsens, please return to urgent care for reevaluation.  If you develop any new or worsening symptoms or do not improve in the next 2 to 3 days, please return.  If your symptoms are severe, please go to the emergency room.  Follow-up with your primary care provider for further evaluation and management of your symptoms as well as ongoing wellness visits.  I hope you feel better!     ED Prescriptions   None    PDMP not reviewed this encounter.   Carlisle Beers, [place] 02/21/23 1111

## 2023-02-21 NOTE — Discharge Instructions (Addendum)
Your symptoms are like due to allergic conjunctivitis.  Please use olopatadine (Pataday) eyedrops twice daily for the next several days to help with symptoms.  You may also purchase lubricating eyedrops over-the-counter to help with dry eyes.  Perform warm compresses to your eyes before applying drops to reduce swelling and inflammation/irritation.  If you notice that the swelling is getting worse or if you notice any crusting/white/yellow drainage to your eyes or if your eyes become very red and pain worsens, please return to urgent care for reevaluation.  If you develop any new or worsening symptoms or do not improve in the next 2 to 3 days, please return.  If your symptoms are severe, please go to the emergency room.  Follow-up with your primary care provider for further evaluation and management of your symptoms as well as ongoing wellness visits.  I hope you feel better!

## 2023-03-01 ENCOUNTER — Encounter (HOSPITAL_COMMUNITY): Payer: Self-pay

## 2023-03-01 ENCOUNTER — Ambulatory Visit (HOSPITAL_COMMUNITY)
Admission: EM | Admit: 2023-03-01 | Discharge: 2023-03-01 | Disposition: A | Discharge status: Home / Self Care | Payer: BC Managed Care – PPO | Attending: Urgent Care | Admitting: Urgent Care

## 2023-03-01 DIAGNOSIS — A084 Viral intestinal infection, unspecified: Secondary | ICD-10-CM

## 2023-03-01 MED ORDER — ONDANSETRON 8 MG PO TBDP: 8.0000 mg | ORAL_TABLET | Freq: Three times a day (TID) | ORAL | 0 refills | Status: DC | PRN

## 2023-03-01 MED ORDER — LOPERAMIDE HCL 2 MG PO CAPS: 2.0000 mg | ORAL_CAPSULE | Freq: Two times a day (BID) | ORAL | 0 refills | Status: DC | PRN

## 2023-03-01 NOTE — ED Provider Notes (Signed)
[facility]   MRN: [id number] DOB: [birth year]  Subjective:   Cassandra Waters is a 22 y.o. female presenting for acute onset this morning of nausea, vomiting (2 episodes), mild headache that is now resolved.  Had 1 sick contact with her boyfriend who had very similar symptoms. Patient is currently on medication for contraception, no period in the past 2 months.  No bloody stools.  No fever, recent antibiotic use, hospitalizations or long distance travel.  Has not eaten raw foods, drank unfiltered water.  No history of GI disorders including Crohn's, IBS, ulcerative colitis.  No marijuana use.  No alcohol use.  No current facility-administered medications for this encounter.  Current Outpatient Medications:    erythromycin ophthalmic ointment, Place a 1/2 inch ribbon of ointment into the lower eyelid 4 times a day for the next 5-7 days, Disp: 3.5 g, Rfl: 0   norethindrone-ethinyl estradiol (LOESTRIN FE) 1-20 MG-MCG tablet, TAKE ONE (1) TABLET EACH DAY, Disp: 3 Package, Rfl: 4   ondansetron (ZOFRAN-ODT) 8 MG disintegrating tablet, Take 1 tablet (8 mg total) by mouth every 8 (eight) hours as needed for nausea or vomiting., Disp: 20 tablet, Rfl: 0   sertraline (ZOLOFT) 50 MG tablet, Take 1 tablet (50 mg total) by mouth daily., Disp: 90 tablet, Rfl: 1   No Known Allergies  Past Medical History:  Diagnosis Date   Acne    Anxiety    Dysmenorrhea      History reviewed. No pertinent surgical history.  Family History  Problem Relation Age of Onset   Mental illness Mother    Mental illness Father    Mental illness Sister    Mental illness Maternal Grandmother     Social History   Tobacco Use   Smoking status: Passive Smoke Exposure - Never Smoker   Smokeless tobacco: Never  Vaping Use   Vaping Use: Never used  Substance Use Topics   Alcohol use: No   Drug use: No    ROS   Objective:   Vitals: BP 110/74 (BP Location: Left Arm)   Pulse 73   Temp 98.4 F (36.9  C) (Oral)   Resp 16   Ht 5\' 1"  (1.549 m)   Wt 150 lb (68 kg)   SpO2 98%   BMI 28.34 kg/m   Physical Exam Constitutional:      General: She is not in acute distress.    Appearance: Normal appearance. She is well-developed. She is not ill-appearing, toxic-appearing or diaphoretic.  HENT:     Head: Normocephalic and atraumatic.     Nose: Nose normal.     Mouth/Throat:     Mouth: Mucous membranes are moist.  Eyes:     General: No scleral icterus.       Right eye: No discharge.        Left eye: No discharge.     Extraocular Movements: Extraocular movements intact.     Conjunctiva/sclera: Conjunctivae normal.  Cardiovascular:     Rate and Rhythm: Normal rate.  Pulmonary:     Effort: Pulmonary effort is normal.  Abdominal:     General: Bowel sounds are increased. There is no distension.     Palpations: Abdomen is soft. There is no mass.     Tenderness: There is abdominal tenderness (mild, generalized). There is no right CVA tenderness, left CVA tenderness, guarding or rebound.  Skin:    General: Skin is warm and dry.  Neurological:     General: No focal deficit  present.     Mental Status: She is alert and oriented to person, place, and time.  Psychiatric:        Mood and Affect: Mood normal.        Behavior: Behavior normal.        Thought Content: Thought content normal.        Judgment: Judgment normal.     Assessment and Plan :   PDMP not reviewed this encounter.  1. Viral gastroenteritis    No signs of an acute abdomen.  Will manage for suspected viral gastroenteritis with supportive care.  Recommended patient hydrate well, eat light meals and maintain electrolytes.  Will use Zofran and Imodium for nausea, vomiting and diarrhea. Counseled patient on potential for adverse effects with medications prescribed/recommended today, ER and return-to-clinic precautions discussed, patient verbalized understanding.    Wallis Bamberg, PA-C 03/01/23 1326

## 2023-03-01 NOTE — Discharge Instructions (Addendum)
You have a viral stomach infection that will take 5-10 days to really clear. Make sure you push fluids drinking mostly water but mix it with Gatorade.  Try to eat light meals including soups, broths and soft foods, fruits.  You may use Zofran for your nausea and vomiting once every 8 hours.  Imodium can help with diarrhea but use this carefully limiting it to 1-2 times per day only if you are having a lot of diarrhea.  Please return to the clinic if symptoms worsen or you start having severe abdominal pain not helped by taking Tylenol or start having bloody stools or blood in the vomit.

## 2023-03-01 NOTE — ED Triage Notes (Signed)
Patient here today with c/o N&V and HA that started this morning. She took IBU to help with the HA. Vomited twice. Nausea currently. No fever. Friend had a virus a week ago.

## 2023-04-15 ENCOUNTER — Other Ambulatory Visit: Payer: Self-pay

## 2023-04-15 ENCOUNTER — Encounter (HOSPITAL_COMMUNITY): Payer: Self-pay | Admitting: Emergency Medicine

## 2023-04-15 ENCOUNTER — Ambulatory Visit (HOSPITAL_COMMUNITY)
Admission: EM | Admit: 2023-04-15 | Discharge: 2023-04-15 | Disposition: A | Discharge status: Home / Self Care | Payer: BC Managed Care – PPO | Attending: Family Medicine | Admitting: Family Medicine

## 2023-04-15 DIAGNOSIS — R519 Headache, unspecified: Secondary | ICD-10-CM | POA: Diagnosis not present

## 2023-04-15 MED ORDER — IBUPROFEN 800 MG PO TABS: 800.0000 mg | ORAL_TABLET | Freq: Three times a day (TID) | ORAL | 0 refills | Status: DC

## 2023-04-15 NOTE — ED Provider Notes (Signed)
[facility]   [id number] 04/15/23 Arrival Time: 1106  ASSESSMENT & PLAN:  1. Bad headache    Declines IM medications today; prefers trial of Rx ibuprofen.  Meds ordered this encounter  Medications   ibuprofen (ADVIL) 800 MG tablet    Sig: Take 1 tablet (800 mg total) by mouth 3 (three) times daily with meals.    Dispense:  21 tablet    Refill:  0   Normal neurological exam. Afebrile without nuchal rigidity. Discussed. Current presentation and symptoms are consistent with prior migraines and are not consistent with SAH, ICH, meningitis, or temporal arteritis.  Work note provided.  Recommend:  Follow-up Information     [facility] at [facility]   Specialty: Emergency Medicine Why: If symptoms worsen in any way. Contact information: [address] [id number] mc [place] [address] [phone]        Schedule an appointment as soon as possible for a visit  with [facility]   Why: To discuss your long-standing headaches. Contact information: [address] [place] [address] [phone]                  Reviewed expectations re: course of current medical issues. Questions answered. Outlined signs and symptoms indicating need for more acute intervention. Patient verbalized understanding. After Visit Summary given.   SUBJECTIVE: History from: Patient. Patient is able to give a clear and coherent history.  Cassandra Waters is a 22 y.o. female who presents with complaint of a migraine headache. Onset gradual, approx 2 days ago, better yesterday, now back today. Location: bilateral, temporal without radiation. History of headaches: yes, with similar symptoms. Precipitating factors include: none which have been determined. Associated symptoms: Preceding aura: no. Nausea/vomiting: no. Vision changes: no. Increased sensitivity to light and to noises: mild. Fever: denied. Sinus  pressure/congestion: no. Extremity weakness: no. Current headache has limited normal daily activities; missed work yesterday. Denies dizziness, loss of balance, muscle weakness, numbness of extremities, speech difficulties, and vision problems. No head injury reported. Ambulatory without difficulty. No recent travel.  OBJECTIVE:  Vitals:   04/15/23 1125 04/15/23 1126  BP: 105/68   Pulse: 72   Resp: 18   Temp: 98 F (36.7 C)   TempSrc: Oral   SpO2: 99%   Weight:  69 kg  Height:  5\' 1"  (1.549 m)    General appearance: alert; NAD HENT: normocephalic; atraumatic Eyes: PERRLA; EOMI; conjunctivae normal Neck: supple with FROM Lungs: unlabored respirations Heart: regular Extremities: no edema; symmetrical with no gross deformities Skin: warm and dry Neurologic: alert; speech is fluent and clear without dysarthria or aphasia; CN 2-12 grossly intact; no facial droop; normal gait; normal symmetric reflexes; normal extremity strength and sensation throughout; bilateral upper and lower extremity sensation is grossly intact with 5/5 symmetric strength; normal grip strength Psychological: alert and cooperative; normal mood and affect   No Known Allergies  Past Medical History:  Diagnosis Date   Acne    Anxiety    Dysmenorrhea    Social History   Socioeconomic History   Marital status: Single    Spouse name: Not on file   Number of children: Not on file   Years of education: Not on file   Highest education level: Not on file  Occupational History   Not on file  Tobacco Use   Smoking status: Passive Smoke Exposure - Never Smoker   Smokeless tobacco: Never  Vaping Use   Vaping Use: Never used  Substance and Sexual Activity   Alcohol use: No   Drug use: No   Sexual activity: Not on file  Other Topics Concern   Not on file  Social History Narrative   Not on file   Social Determinants of Health   Financial Resource Strain: Not on file  Food Insecurity: Not on file   Transportation Needs: Not on file  Physical Activity: Not on file  Stress: Not on file  Social Connections: Not on file  Intimate Partner Violence: Not on file   Family History  Problem Relation Age of Onset   Mental illness Mother    Mental illness Father    Mental illness Sister    Mental illness Maternal Grandmother    History reviewed. No pertinent surgical history.    Mardella Layman, MD 04/15/23 [phone]

## 2023-04-15 NOTE — ED Triage Notes (Signed)
Patient c/o bad HA since Monday taking Ibuprofen with no relief, last dose today 09:30 am.

## 2023-12-18 ENCOUNTER — Ambulatory Visit (HOSPITAL_COMMUNITY)
Admission: RE | Admit: 2023-12-18 | Discharge: 2023-12-18 | Disposition: A | Discharge status: Home / Self Care | Payer: Self-pay | Source: Ambulatory Visit | Attending: Family Medicine | Admitting: Family Medicine

## 2023-12-18 ENCOUNTER — Encounter (HOSPITAL_COMMUNITY): Payer: Self-pay

## 2023-12-18 VITALS — BP 108/61 | HR 76 | Temp 98.3°F | Resp 16

## 2023-12-18 DIAGNOSIS — Z202 Contact with and (suspected) exposure to infections with a predominantly sexual mode of transmission: Secondary | ICD-10-CM | POA: Insufficient documentation

## 2023-12-18 NOTE — ED Triage Notes (Signed)
 Patient is here for std testing. Patient denies any symptoms.

## 2023-12-18 NOTE — Discharge Instructions (Addendum)
 Staff will notify you if there is anything positive on the swab

## 2023-12-18 NOTE — ED Provider Notes (Signed)
 [facility]    CSN: [id number] Arrival date & time: 12/18/23  4098      History   Chief Complaint Chief Complaint  Patient presents with   SEXUALLY TRANSMITTED DISEASE    I would like to be tested for STD. - Entered by patient    HPI Cassandra Waters is a 23 y.o. female.   HPI Here for testing for STDs.  She does not have any vaginal discharge or itching or pelvic pain or fever or vomiting.   NKDA Last menstrual cycle began today. 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 No pertinent surgical history.  OB History   No obstetric history on file.      Home Medications    Prior to Admission medications   Medication Sig Start Date End Date Taking? Authorizing Provider  norethindrone-ethinyl estradiol (LOESTRIN FE) 1-20 MG-MCG tablet TAKE ONE (1) TABLET EACH DAY 02/16/20   Hawks, Christy A, FNP  sertraline (ZOLOFT) 50 MG tablet Take 1 tablet (50 mg total) by mouth daily. 02/16/20   Junie Spencer, FNP    Family History Family History  Problem Relation Age of Onset   Mental illness Mother    Mental illness Father    Mental illness Sister    Mental illness Maternal Grandmother     Social History Social History   Tobacco Use   Smoking status: Passive Smoke Exposure - Never Smoker   Smokeless tobacco: Never  Vaping Use   Vaping status: Never Used  Substance Use Topics   Alcohol use: No   Drug use: No     Allergies   Patient has no known allergies.   Review of Systems Review of Systems   Physical Exam Triage Vital Signs ED Triage Vitals [12/18/23 1006]  Encounter Vitals  Group     BP 108/61     Systolic BP Percentile      Diastolic BP Percentile      Pulse Rate 76     Resp 16     Temp 98.3 F (36.8 C)     Temp Source Oral     SpO2 96 %     Weight      Height      Head Circumference      Peak Flow      Pain Score      Pain Loc      Pain Education      Exclude from Growth Chart    No data found.  Updated Vital Signs BP 108/61 (BP Location: Left Arm)   Pulse 76   Temp 98.3 F (36.8 C) (Oral)   Resp 16   LMP 12/18/2023   SpO2 96%   Visual Acuity Right Eye Distance:   Left Eye Distance:   Bilateral Distance:    Right Eye Near:   Left Eye Near:    Bilateral Near:     Physical Exam Vitals reviewed.  Skin:    Coloration: Skin is not pale.  Neurological:     Mental Status: She is alert and oriented to person, place, and time.  Psychiatric:  Behavior: Behavior normal.      UC Treatments / Results  Labs (all labs ordered are listed, but only abnormal results are displayed) Labs Reviewed  CERVICOVAGINAL ANCILLARY ONLY    EKG   Radiology No results found.  Procedures Procedures (including critical care time)  Medications Ordered in UC Medications - No data to display  Initial Impression / Assessment and Plan / UC Course  I have reviewed the triage vital signs and the nursing notes.  Pertinent labs & imaging results that were available during my care of the patient were reviewed by me and considered in my medical decision making (see chart for details).     Vaginal self swab is done, and we will notify of any positives on that and treat per protocol. She declines my offer of HIV and syphilis screening. Final Clinical Impressions(s) / UC Diagnoses   Final diagnoses:  Potential exposure to STD     Discharge Instructions      Staff will notify you if there is anything positive on the swab.      ED Prescriptions   None    PDMP not reviewed this encounter.   Zenia Resides, MD 12/18/23  1045

## 2023-12-21 LAB — CERVICOVAGINAL ANCILLARY ONLY
Chlamydia: NEGATIVE
Comment: NEGATIVE
Comment: NEGATIVE
Comment: NORMAL
Neisseria Gonorrhea: NEGATIVE
Trichomonas: NEGATIVE

## 2024-03-03 ENCOUNTER — Ambulatory Visit (HOSPITAL_COMMUNITY)
Admission: EM | Admit: 2024-03-03 | Discharge: 2024-03-03 | Disposition: A | Discharge status: Home / Self Care | Payer: Self-pay

## 2024-03-03 ENCOUNTER — Encounter (HOSPITAL_COMMUNITY): Payer: Self-pay | Admitting: *Deleted

## 2024-03-03 ENCOUNTER — Other Ambulatory Visit: Payer: Self-pay

## 2024-03-03 DIAGNOSIS — Z207 Contact with and (suspected) exposure to pediculosis, acariasis and other infestations: Secondary | ICD-10-CM

## 2024-03-03 MED ORDER — PERMETHRIN 5 % EX CREA
1.0000 | TOPICAL_CREAM | Freq: Once | CUTANEOUS | 0 refills | Status: AC
Start: 2024-03-03 — End: 2024-03-03

## 2024-03-03 NOTE — ED Provider Notes (Signed)
 [facility]    CSN: [id number] Arrival date & time: 03/03/24  1754      History   Chief Complaint Chief Complaint  Patient presents with   Sarcoptes Scabiei    HPI Cassandra Waters is a 23 y.o. female.   Patient presents today requesting medication for scabies.  She reports that her significant other who lives with her has a pruritic rash consistent with previous episodes of scabies.  He is being evaluated and hopefully treated and so she is also requesting treatment.  She is not currently experiencing any symptoms including rash or pruritus.  She has not applied any medications over-the-counter.  She denies history of dermatological condition.  She is confident that she is not pregnant.    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 No pertinent surgical history.  OB History   No obstetric history on file.      Home Medications    Prior to Admission medications   Medication Sig Start Date End Date Taking? Authorizing Provider  escitalopram (LEXAPRO) 10 MG tablet Take 10 mg by mouth daily.   Yes [provider]  norethindrone-ethinyl estradiol (LOESTRIN FE) 1-20 MG-MCG tablet TAKE ONE (1) TABLET EACH DAY 02/16/20  Yes Hawks, Christy A, FNP  permethrin (ELIMITE) 5 % cream Apply 1 Application topically once for 1 dose. Apply from scalp to toes avoiding area around eyes and mouth.  Leave on for 8 to 12 hours (overnight) then remove by taking a shower. 03/03/24 03/03/24 Yes Eulalia Ellerman, Betsey Brow, PA-C    Family History Family History  Problem Relation Age of Onset   Mental illness Mother    Mental  illness Father    Mental illness Sister    Mental illness Maternal Grandmother     Social History Social History   Tobacco Use   Smoking status: Passive Smoke Exposure - Never Smoker   Smokeless tobacco: Never  Vaping Use   Vaping status: Never Used  Substance Use Topics   Alcohol use: No   Drug use: No     Allergies   Patient has no known allergies.   Review of Systems Review of Systems  Constitutional:  Negative for activity change and fever.  Musculoskeletal:  Negative for arthralgias and myalgias.  Skin:  Negative for rash.  Neurological:  Negative for weakness and numbness.     Physical Exam Triage Vital Signs ED Triage Vitals  Encounter Vitals Group     BP 03/03/24 1929 97/61     Systolic BP Percentile --      Diastolic BP Percentile --      Pulse Rate 03/03/24 1929 65     Resp 03/03/24 1929 18     Temp 03/03/24 1929 98.2 F (36.8 C)     Temp src --      SpO2 03/03/24 1929 97 %     Weight --      Height --      Head Circumference --      Peak Flow --      Pain Score 03/03/24 1927 0  Pain Loc --      Pain Education --      Exclude from Growth Chart --    No data found.  Updated Vital Signs BP 97/61   Pulse 65   Temp 98.2 F (36.8 C)   Resp 18   LMP 02/11/2024   SpO2 97%   Visual Acuity Right Eye Distance:   Left Eye Distance:   Bilateral Distance:    Right Eye Near:   Left Eye Near:    Bilateral Near:     Physical Exam Vitals reviewed.  Constitutional:      General: She is awake. She is not in acute distress.    Appearance: Normal appearance. She is well-developed. She is not ill-appearing.     Comments: Very pleasant female appears stated age in no acute distress sitting comfortably in exam room  HENT:     Head: Normocephalic and atraumatic.  Cardiovascular:     Rate and Rhythm: Normal rate and regular rhythm.     Heart sounds: Normal heart sounds, S1 normal and S2 normal. No murmur heard. Pulmonary:     Effort: Pulmonary  effort is normal.     Breath sounds: Normal breath sounds. No wheezing, rhonchi or rales.     Comments: Clear to auscultation bilaterally Skin:    Findings: No rash.  Psychiatric:        Behavior: Behavior is cooperative.      UC Treatments / Results  Labs (all labs ordered are listed, but only abnormal results are displayed) Labs Reviewed - No data to display  EKG   Radiology No results found.  Procedures Procedures (including critical care time)  Medications Ordered in UC Medications - No data to display  Initial Impression / Assessment and Plan / UC Course  I have reviewed the triage vital signs and the nursing notes.  Pertinent labs & imaging results that were available during my care of the patient were reviewed by me and considered in my medical decision making (see chart for details).     Patient is well-appearing, afebrile, nontoxic, nontachycardic.  No evidence of rash or scabies, however, given concern for exposure she was prescribed permethrin with instruction to apply this at the same time that her significant other is being treated.  She is to leave it on overnight and then rinse it off.  Also recommended that she clean all of her linens and close around the same time in hot water and with high heat.  Discussed that if she develops any symptoms that she should return for reevaluation.  All questions were answered to patient satisfaction.  Final Clinical Impressions(s) / UC Diagnoses   Final diagnoses:  Scabies exposure     Discharge Instructions      We are treating you for scabies.  Apply the medication as we discussed and leave it on overnight.  Take a shower the next day to remove it.  Wash everything in hot water and dried on high heat.  If you develop any rash or other symptoms please return for reevaluation.   ED Prescriptions     Medication Sig Dispense Auth. Provider   permethrin (ELIMITE) 5 % cream Apply 1 Application topically once for 1  dose. Apply from scalp to toes avoiding area around eyes and mouth.  Leave on for 8 to 12 hours (overnight) then remove by taking a shower. 60 g Aylen Stradford K, PA-C      PDMP not reviewed this encounter.   Synethia Endicott, [place]  K, PA-C 03/03/24 2018

## 2024-03-03 NOTE — ED Triage Notes (Signed)
 PT reports partnerr has possible scabies. Partner has nort been DX with scabies. Pt does not has any signs or sx's of scabies. Pt wants cream for scabies. Partner is waiting in car.

## 2024-03-03 NOTE — Discharge Instructions (Signed)
 We are treating you for scabies.  Apply the medication as we discussed and leave it on overnight.  Take a shower the next day to remove it.  Wash everything in hot water and dried on high heat.  If you develop any rash or other symptoms please return for reevaluation.

## 2024-08-27 ENCOUNTER — Encounter (HOSPITAL_COMMUNITY): Payer: Self-pay | Admitting: Emergency Medicine

## 2024-08-27 ENCOUNTER — Other Ambulatory Visit: Payer: Self-pay

## 2024-08-27 ENCOUNTER — Ambulatory Visit (HOSPITAL_COMMUNITY): Admission: EM | Admit: 2024-08-27 | Discharge: 2024-08-27 | Disposition: A | Discharge status: Home / Self Care

## 2024-08-27 DIAGNOSIS — H1031 Unspecified acute conjunctivitis, right eye: Secondary | ICD-10-CM

## 2024-08-27 NOTE — ED Triage Notes (Signed)
 This morning, right eye was crusty at eyelash edge.  For the past 3 days has been itching.    This has happened before and reports having erythromycin  ointment prescribed.  Patient still has this tube.  Patient used this medicine in right eye last night.  Patient does not wear contacts.    Reports vision is unchanged.   Patient has not used make up in four days.  Patient has a few red dots to right cheek, below right eye

## 2024-08-27 NOTE — ED Provider Notes (Signed)
 [facility]    CSN: [id number] Arrival date & time: 08/27/24  1159      History   Chief Complaint No chief complaint on file.   HPI Cassandra Waters is a 23 y.o. female.   Patient presents to clinic over concern of right eye redness for the past 3 to 4 days.  She has also been having itching and gritty sensation to the right eye.  She normally wears a fullface of make-up and strip eyelashes.  Noticed a few days ago when she was taking the eyelash off that it had some crusting and skin flakes.  Had some leftover erythromycin  ointment so she used that last night.  This morning she woke up and the lashes were crusted with yellow discharge.  Vision is unchanged.  Does not wear contacts or glasses.  Noticed a few areas of redness to the upper eyelids and right below her eye.  The history is provided by the patient and medical records.    Past Medical History:  Diagnosis Date   Acne    Anxiety    Dysmenorrhea     Patient Active Problem List   Diagnosis Date Noted   Depression, major, single episode, mild 02/16/2020   Encounter for surveillance of contraceptive pills 02/07/2019   Seborrhea 04/29/2017   Eczema 04/29/2017   Generalized anxiety disorder 08/22/2016   Acne vulgaris 08/22/2016   Palpitations 12/17/2015    History reviewed. No pertinent surgical history.  OB History   No obstetric history on file.      Home Medications    Prior to Admission medications   Medication Sig Start Date End Date Taking? Authorizing Provider  escitalopram (LEXAPRO) 10 MG tablet Take 10 mg by mouth daily.    [provider]  norethindrone-ethinyl estradiol (LOESTRIN FE) 1-20 MG-MCG tablet TAKE ONE (1) TABLET EACH DAY 02/16/20   Lavell Bari LABOR, FNP    Family History Family History  Problem Relation Age of Onset   Mental illness Mother    Mental illness Father    Mental illness Sister    Mental illness Maternal Grandmother     Social History Social History    Tobacco Use   Smoking status: Former    Types: Cigarettes    Passive exposure: Yes   Smokeless tobacco: Never  Vaping Use   Vaping status: Never Used  Substance Use Topics   Alcohol use: No   Drug use: Yes    Types: Marijuana     Allergies   Patient has no known allergies.   Review of Systems Review of Systems  Per HPI  Physical Exam Triage Vital Signs ED Triage Vitals  Encounter Vitals Group     BP 08/27/24 1336 116/70     Girls Systolic BP Percentile --      Girls Diastolic BP Percentile --      Boys Systolic BP Percentile --      Boys Diastolic BP Percentile --      Pulse Rate 08/27/24 1336 68     Resp 08/27/24 1336 18     Temp 08/27/24 1336 98.2 F (36.8 C)     Temp Source 08/27/24 1336 Oral     SpO2 08/27/24 1336 97 %     Weight --      Height --      Head Circumference --      Peak Flow --      Pain Score 08/27/24 1333 7     Pain Loc --  Pain Education --      Exclude from Growth Chart --    No data found.  Updated Vital Signs BP 116/70 (BP Location: Right Arm)   Pulse 68   Temp 98.2 F (36.8 C) (Oral)   Resp 18   LMP 08/23/2024   SpO2 97%   Visual Acuity Right Eye Distance:   Left Eye Distance:   Bilateral Distance:    Right Eye Near:   Left Eye Near:    Bilateral Near:     Physical Exam Vitals and nursing note reviewed.  Constitutional:      Appearance: Normal appearance.  HENT:     Head: Normocephalic and atraumatic.     Right Ear: External ear normal.     Left Ear: External ear normal.     Nose: Nose normal.     Mouth/Throat:     Mouth: Mucous membranes are moist.  Eyes:     General: Vision grossly intact. Gaze aligned appropriately.     Extraocular Movements: Extraocular movements intact.     Conjunctiva/sclera:     Right eye: Right conjunctiva is injected.     Comments: Right eye with injection, upper eyelid blepharitis   Cardiovascular:     Rate and Rhythm: Normal rate.  Pulmonary:     Effort: Pulmonary effort  is normal. No respiratory distress.  Skin:    General: Skin is warm and dry.  Neurological:     General: No focal deficit present.     Mental Status: She is alert and oriented to person, place, and time.  Psychiatric:        Mood and Affect: Mood normal.        Behavior: Behavior normal. Behavior is cooperative.      UC Treatments / Results  Labs (all labs ordered are listed, but only abnormal results are displayed) Labs Reviewed - No data to display  EKG   Radiology No results found.  Procedures Procedures (including critical care time)  Medications Ordered in UC Medications - No data to display  Initial Impression / Assessment and Plan / UC Course  I have reviewed the triage vital signs and the nursing notes.  Pertinent labs & imaging results that were available during my care of the patient were reviewed by me and considered in my medical decision making (see chart for details).  Vitals and triage reviewed, patient is hemodynamically stable.  Right eye with conjunctival injection.  Eyelashes crusted shut this morning with yellow drainage consistent with bacterial conjunctivitis.  Could be secondary to strip lash use.  Does not wear glasses.  Vision grossly intact.  Atraumatic.  Upper eyelid blepharitis.  Patient already has erythromycin  ointment, not expired.  Will use this 4 times daily for the next 5 days to cover for bacterial conjunctivitis.  Plan of care, follow-up care return precautions given, no questions at this time.  Work note provided.    Final Clinical Impressions(s) / UC Diagnoses   Final diagnoses:  Acute bacterial conjunctivitis of right eye     Discharge Instructions      Use the erythromycin  ointment to the right lower eyelid 4 times daily for the next 5 days.  You can use warm compresses to help with swelling and discomfort.  Wash hands before and after applying the ointment.  Avoid make-up or lashes until the area fully heals.  Return to  clinic or seek follow-up care with a optometrist if you develop any vision changes or new concerning symptoms.  ED Prescriptions   None    PDMP not reviewed this encounter.   Dreama Delores SAILOR, FNP 08/27/24 1406

## 2024-08-27 NOTE — Discharge Instructions (Addendum)
 Use the erythromycin  ointment to the right lower eyelid 4 times daily for the next 5 days.  You can use warm compresses to help with swelling and discomfort.  Wash hands before and after applying the ointment.  Avoid make-up or lashes until the area fully heals.  Return to clinic or seek follow-up care with a optometrist if you develop any vision changes or new concerning symptoms.

## 2024-11-09 ENCOUNTER — Encounter (HOSPITAL_COMMUNITY): Payer: Self-pay

## 2024-11-09 ENCOUNTER — Inpatient Hospital Stay (HOSPITAL_COMMUNITY)
Admission: AD | Admit: 2024-11-09 | Discharge: 2024-11-09 | Disposition: A | Discharge status: Home / Self Care | Payer: Self-pay | Attending: Obstetrics and Gynecology | Admitting: Obstetrics and Gynecology

## 2024-11-09 ENCOUNTER — Other Ambulatory Visit: Payer: Self-pay

## 2024-11-09 DIAGNOSIS — W109XXA Fall (on) (from) unspecified stairs and steps, initial encounter: Secondary | ICD-10-CM | POA: Insufficient documentation

## 2024-11-09 DIAGNOSIS — Z3A01 Less than 8 weeks gestation of pregnancy: Secondary | ICD-10-CM | POA: Insufficient documentation

## 2024-11-09 DIAGNOSIS — O26891 Other specified pregnancy related conditions, first trimester: Secondary | ICD-10-CM

## 2024-11-09 DIAGNOSIS — M549 Dorsalgia, unspecified: Secondary | ICD-10-CM | POA: Insufficient documentation

## 2024-11-09 DIAGNOSIS — W19XXXA Unspecified fall, initial encounter: Secondary | ICD-10-CM

## 2024-11-09 DIAGNOSIS — O9A211 Injury, poisoning and certain other consequences of external causes complicating pregnancy, first trimester: Secondary | ICD-10-CM | POA: Insufficient documentation

## 2024-11-09 LAB — POCT PREGNANCY, URINE: Preg Test, Ur: POSITIVE — AB

## 2024-11-09 NOTE — MAU Note (Addendum)
 Cassandra Waters is a 24 y.o. at Unknown here in MAU reporting: fall down steps this morning at 1030 on back and reporting back pain.  Denies VB or increase in vaginal discharge or abd pain HPT pos UPT here pos LMP: 09/21/24 est Onset of complaint: 1030 Pain score: 4/10 Vitals:   11/09/24 1530  BP: 120/74  Pulse: 83  Resp: 16  Temp: 98.8 F (37.1 C)  SpO2: 100%     FHT: na  Lab orders placed from triage: none  Denies any meds taken for pain.

## 2024-11-09 NOTE — Discharge Instructions (Signed)
 " [facility] [employer] for [employer] at [facility]  [address] [place], [place] [address]  [phone]  Center for [facility] at [facility]  [address], [place], [place] [address]  [phone]  Center for [facility] at [facility] [address] [place], [place] [address]  [phone]  Center for [facility] at [facility] [address], [place], [place] [address] [phone]  Center for [facility] at [facility]  [address], [place], [place] [address]  [phone]  Center for [facility] at [facility]  [address]), [place], [place] [address]  [phone]  Center for [facility] at [facility]  [address] [place], [place], [place] [address]  [phone]  [facility]  [address], [place], [place] [address]  [phone]  [facility]  [address] [place], [place], [place] [address]  [phone]  [facility]  [address], [place], [place] [address]  [phone]  [facility]  [address], [place], [place] [address]  [phone]  [facility]   [address] [place], [place] [address]  [phone]  [facility]  [address], [place], [place] [address]   [phone]  [facility] [address], [place], [place] [address] [phone]  [facility]  [address], [place], [place] [address]  [phone]           Safe Medications in Pregnancy   Acne: Benzoyl Peroxide Salicylic Acid  Backache/Headache: Tylenol: 2 regular strength every 4 hours OR              2 Extra strength every 6 hours  Colds/Coughs/Allergies: Benadryl (alcohol free) 25 mg every 6 hours as needed Breath right strips Claritin Cepacol throat lozenges Chloraseptic throat spray Cold-Eeze- up to three times per day Cough  drops, alcohol free Flonase (by prescription only) Guaifenesin Mucinex Robitussin DM (plain only, alcohol free) Saline nasal spray/drops Sudafed (pseudoephedrine) & Actifed ** use only after [redacted] weeks gestation and if you do not have high blood pressure Tylenol Vicks Vaporub Zinc lozenges Zyrtec   Constipation: Colace Ducolax suppositories Fleet enema Glycerin suppositories Metamucil Milk of magnesia Miralax Senokot Smooth move tea  Diarrhea: Kaopectate Imodium A-D  *NO pepto Bismol  Hemorrhoids: Anusol Anusol HC Preparation H Tucks  Indigestion: Tums Maalox Mylanta Zantac  Pepcid  Insomnia: Benadryl (alcohol free) 25mg  every 6 hours as needed Tylenol PM Unisom, no Gelcaps  Leg Cramps: Tums MagGel  Nausea/Vomiting:  Bonine Dramamine Emetrol Ginger extract Sea bands Meclizine  Nausea medication to take during pregnancy:  Unisom (doxylamine succinate 25 mg tablets) Take one tablet daily at bedtime. If symptoms are not adequately controlled, the dose can be increased to a maximum recommended dose of two tablets daily (1/2 tablet in the morning, 1/2 tablet mid-afternoon and one at bedtime). Vitamin B6 100mg  tablets. Take one tablet twice a day (up to 200 mg per day).  Skin Rashes: Aveeno products Benadryl cream or 25mg  every 6 hours as needed Calamine Lotion 1% cortisone cream  Yeast infection: Gyne-lotrimin 7 Monistat 7  Gum/tooth pain: Anbesol  **If taking multiple medications, please check labels to avoid duplicating the same active ingredients **take medication as directed on the label ** Do not exceed 4000 mg of tylenol in 24 hours **Do not take medications that  contain aspirin or ibuprofen    "

## 2024-11-09 NOTE — MAU Provider Note (Signed)
 " History     CSN: [id number]  Arrival date and time: 11/09/24 1454   Event Date/Time   First Provider Initiated Contact with Patient 11/09/24 1544      Chief Complaint  Patient presents with   Fall   Back Pain   HPI Cassandra Waters is a 24 y.o. G1P0 at [redacted]w[redacted]d who presents for evaluation after a fall. States she fell down a few steps this morning at around 1030. Landed on her bottom/back. States back is sore where she landed. Denies abdominal pain or vaginal bleeding. Wanted to check on baby. Hasn't treated symptoms b/c unsure what she can take in pregnancy.   OB History     Gravida  1   Para      Term      Preterm      AB      Living         SAB      IAB      Ectopic      Multiple      Live Births              Past Medical History:  Diagnosis Date   Acne    Anxiety    Dysmenorrhea     No past surgical history on file.  Family History  Problem Relation Age of Onset   Mental illness Mother    Mental illness Father    Mental illness Sister    Mental illness Maternal Grandmother     Social History[1]  Allergies: Allergies[2]  Medications Prior to Admission  Medication Sig Dispense Refill Last Dose/Taking   Prenatal Vit-Fe Fumarate-FA (MULTIVITAMIN-PRENATAL) 27-0.8 MG TABS tablet Take 1 tablet by mouth daily at 12 noon.   11/08/2024   escitalopram (LEXAPRO) 10 MG tablet Take 10 mg by mouth daily. (Patient not taking: Reported on 11/09/2024)   Not Taking   norethindrone-ethinyl estradiol (LOESTRIN FE) 1-20 MG-MCG tablet TAKE ONE (1) TABLET EACH DAY (Patient not taking: Reported on 11/09/2024) 3 Package 4 Not Taking    Review of Systems  All other systems reviewed and are negative.  Physical Exam   Blood pressure 120/74, pulse 83, temperature 98.8 F (37.1 C), temperature source Oral, resp. rate 16, height 5' 1 (1.549 m), weight 73.5 kg, last menstrual period 09/21/2024, SpO2 100%.  Physical Exam Vitals and nursing note reviewed.  Constitutional:       General: She is not in acute distress.    Appearance: She is well-developed. She is not ill-appearing.  HENT:     Head: Normocephalic and atraumatic.  Eyes:     General: No scleral icterus.       Right eye: No discharge.        Left eye: No discharge.     Conjunctiva/sclera: Conjunctivae normal.  Pulmonary:     Effort: Pulmonary effort is normal. No respiratory distress.  Abdominal:     Palpations: Abdomen is soft.     Tenderness: There is no abdominal tenderness.  Neurological:     General: No focal deficit present.     Mental Status: She is alert.  Psychiatric:        Mood and Affect: Mood normal.        Behavior: Behavior normal.    Pt informed that the ultrasound is considered a limited OB ultrasound and is not intended to be a complete ultrasound exam.  Patient also informed that the ultrasound is not being completed with the intent of assessing for fetal or  placental anomalies or any pelvic abnormalities.  Explained that the purpose of todays ultrasound is to assess for  viability.  Patient acknowledges the purpose of the exam and the limitations of the study.   IUP measuring [redacted]w[redacted]d, +FCA 128 bpm    MAU Course  Procedures Results for orders placed or performed during the hospital encounter of 11/09/24 (from the past 24 hours)  Pregnancy, urine POC     Status: Abnormal   Collection Time: 11/09/24  3:13 PM  Result Value Ref Range   Preg Test, Ur POSITIVE (A) NEGATIVE    MDM   Assessment and Plan   1. Fall, initial encounter   2. [redacted] weeks gestation of pregnancy    -Patient presents without abdominal pain or vaginal bleeding wanting to check on pregnancy after falling this morning. Informal BSUS shows IUP with +FCA -Message sent to office for official viability/dating scan -Given list of providers for prenatal care & list of pregnancy safe OTC meds. Discussed using ice & heat at home as well as tylenol prn -Reviewed reasons to return to MAU  Rocky Satterfield 11/09/2024, 3:44 PM      [1]  Social History Tobacco Use   Smoking status: Former    Types: Cigarettes    Passive exposure: Yes   Smokeless tobacco: Never  Vaping Use   Vaping status: Never Used  Substance Use Topics   Alcohol use: No   Drug use: Yes    Types: Marijuana  [2] No Known Allergies  "

## 2024-11-17 ENCOUNTER — Ambulatory Visit: Payer: Self-pay

## 2024-11-17 ENCOUNTER — Other Ambulatory Visit: Payer: Self-pay

## 2024-11-17 ENCOUNTER — Other Ambulatory Visit: Payer: Self-pay | Admitting: Obstetrics and Gynecology

## 2024-11-17 DIAGNOSIS — O3680X0 Pregnancy with inconclusive fetal viability, not applicable or unspecified: Secondary | ICD-10-CM

## 2024-11-17 DIAGNOSIS — Z3A01 Less than 8 weeks gestation of pregnancy: Secondary | ICD-10-CM

## 2024-12-01 ENCOUNTER — Ambulatory Visit: Payer: Self-pay | Admitting: Family Medicine

## 2024-12-07 ENCOUNTER — Telehealth: Payer: Self-pay

## 2024-12-15 ENCOUNTER — Encounter: Payer: Self-pay | Admitting: Family Medicine
